# Patient Record
Sex: Female | Born: 1955 | Hispanic: No | Marital: Married | Smoking: Never smoker
Health system: Southern US, Community
[De-identification: ages and names within clinical notes are randomized; demographics above are authoritative.]

## PROBLEM LIST (undated history)

## (undated) HISTORY — PX: MANDIBLE FRACTURE SURGERY: SHX706

---

## 2016-02-27 ENCOUNTER — Emergency Department (HOSPITAL_COMMUNITY)
Admission: EM | Admit: 2016-02-27 | Discharge: 2016-02-27 | Disposition: A | Discharge status: Home / Self Care | Payer: Self-pay | Attending: Emergency Medicine | Admitting: Emergency Medicine

## 2016-02-27 ENCOUNTER — Encounter (HOSPITAL_COMMUNITY): Payer: Self-pay | Admitting: Emergency Medicine

## 2016-02-27 ENCOUNTER — Ambulatory Visit (HOSPITAL_COMMUNITY)
Admission: EM | Admit: 2016-02-27 | Discharge: 2016-02-27 | Disposition: A | Discharge status: Home / Self Care | Payer: Self-pay | Source: Home / Self Care | Attending: Family Medicine | Admitting: Family Medicine

## 2016-02-27 ENCOUNTER — Emergency Department (HOSPITAL_COMMUNITY): Payer: Self-pay

## 2016-02-27 ENCOUNTER — Other Ambulatory Visit: Payer: Self-pay

## 2016-02-27 DIAGNOSIS — R51 Headache: Secondary | ICD-10-CM | POA: Insufficient documentation

## 2016-02-27 DIAGNOSIS — Z87828 Personal history of other (healed) physical injury and trauma: Secondary | ICD-10-CM | POA: Insufficient documentation

## 2016-02-27 DIAGNOSIS — R1013 Epigastric pain: Secondary | ICD-10-CM | POA: Insufficient documentation

## 2016-02-27 DIAGNOSIS — R109 Unspecified abdominal pain: Secondary | ICD-10-CM

## 2016-02-27 DIAGNOSIS — R110 Nausea: Secondary | ICD-10-CM | POA: Insufficient documentation

## 2016-02-27 DIAGNOSIS — R519 Headache, unspecified: Secondary | ICD-10-CM

## 2016-02-27 DIAGNOSIS — Z3202 Encounter for pregnancy test, result negative: Secondary | ICD-10-CM | POA: Insufficient documentation

## 2016-02-27 DIAGNOSIS — R1010 Upper abdominal pain, unspecified: Secondary | ICD-10-CM

## 2016-02-27 LAB — COMPREHENSIVE METABOLIC PANEL
ALT: 27 U/L (ref 14–54)
ANION GAP: 10 (ref 5–15)
AST: 29 U/L (ref 15–41)
Albumin: 4.4 g/dL (ref 3.5–5.0)
Alkaline Phosphatase: 53 U/L (ref 38–126)
BUN: 11 mg/dL (ref 6–20)
CHLORIDE: 108 mmol/L (ref 101–111)
CO2: 21 mmol/L — AB (ref 22–32)
Calcium: 10.6 mg/dL — ABNORMAL HIGH (ref 8.9–10.3)
Creatinine, Ser: 0.75 mg/dL (ref 0.44–1.00)
GFR calc non Af Amer: >60 mL/min (ref >60)
Glucose, Bld: 95 mg/dL (ref 65–99)
POTASSIUM: 4.4 mmol/L (ref 3.5–5.1)
SODIUM: 139 mmol/L (ref 135–145)
Total Bilirubin: 0.8 mg/dL (ref 0.3–1.2)
Total Protein: 7 g/dL (ref 6.5–8.1)

## 2016-02-27 LAB — CBC
HEMATOCRIT: 37.5 % (ref 36.0–46.0)
HEMOGLOBIN: 12.1 g/dL (ref 12.0–15.0)
MCH: 23.6 pg — AB (ref 26.0–34.0)
MCHC: 32.3 g/dL (ref 30.0–36.0)
MCV: 73.1 fL — AB (ref 78.0–100.0)
Platelets: 261 10*3/uL (ref 150–400)
RBC: 5.13 MIL/uL — AB (ref 3.87–5.11)
RDW: 14.7 % (ref 11.5–15.5)
WBC: 6.4 10*3/uL (ref 4.0–10.5)

## 2016-02-27 LAB — LIPASE, BLOOD: Lipase: 21 U/L (ref 11–51)

## 2016-02-27 LAB — URINALYSIS, ROUTINE W REFLEX MICROSCOPIC
Bilirubin Urine: NEGATIVE
GLUCOSE, UA: NEGATIVE mg/dL
HGB URINE DIPSTICK: NEGATIVE
Ketones, ur: NEGATIVE mg/dL
Leukocytes, UA: NEGATIVE
Nitrite: NEGATIVE
Protein, ur: NEGATIVE mg/dL
pH: 7.5 (ref 5.0–8.0)

## 2016-02-27 LAB — I-STAT CG4 LACTIC ACID, ED
LACTIC ACID, VENOUS: 2.25 mmol/L — AB (ref 0.5–2.0)
Lactic Acid, Venous: 2.06 mmol/L (ref 0.5–2.0)
Lactic Acid, Venous: 0.76 mmol/L (ref 0.5–2.0)

## 2016-02-27 LAB — I-STAT TROPONIN, ED: TROPONIN I, POC: 0 ng/mL (ref 0.00–0.08)

## 2016-02-27 LAB — I-STAT BETA HCG BLOOD, ED (MC, WL, AP ONLY)

## 2016-02-27 MED ORDER — HYDROMORPHONE HCL 1 MG/ML IJ SOLN: 0.5000 mg | Freq: Once | INTRAMUSCULAR | Status: DC

## 2016-02-27 MED ORDER — HYDROMORPHONE HCL 1 MG/ML IJ SOLN
0.5000 mg | Freq: Once | INTRAMUSCULAR | Status: AC
  Administered 2016-02-27: 0.5 mg via INTRAVENOUS
  Filled 2016-02-27: qty 1

## 2016-02-27 MED ORDER — HYDROMORPHONE HCL 1 MG/ML IJ SOLN
0.5000 mg | Freq: Once | INTRAMUSCULAR | Status: AC
Start: 2016-02-27 — End: 2016-02-27
  Administered 2016-02-27: 0.5 mg via INTRAVENOUS
  Filled 2016-02-27: qty 1

## 2016-02-27 MED ORDER — ONDANSETRON HCL 4 MG/2ML IJ SOLN
4.0000 mg | INTRAMUSCULAR | Status: AC
  Administered 2016-02-27: 4 mg via INTRAVENOUS
  Filled 2016-02-27: qty 2

## 2016-02-27 MED ORDER — IOPAMIDOL (ISOVUE-300) INJECTION 61%
INTRAVENOUS | Status: AC
  Administered 2016-02-27: 100 mL
  Filled 2016-02-27: qty 100

## 2016-02-27 MED ORDER — SODIUM CHLORIDE 0.9 % IV BOLUS (SEPSIS)
1000.0000 mL | Freq: Once | INTRAVENOUS | Status: AC
  Administered 2016-02-27: 1000 mL via INTRAVENOUS

## 2016-02-27 MED ORDER — GI COCKTAIL ~~LOC~~
30.0000 mL | Freq: Once | ORAL | Status: AC
  Administered 2016-02-27: 30 mL via ORAL
  Filled 2016-02-27: qty 30

## 2016-02-27 MED ORDER — DIPHENHYDRAMINE HCL 50 MG/ML IJ SOLN
12.5000 mg | Freq: Once | INTRAMUSCULAR | Status: AC
  Administered 2016-02-27: 12.5 mg via INTRAVENOUS
  Filled 2016-02-27: qty 1

## 2016-02-27 MED ORDER — METOCLOPRAMIDE HCL 5 MG/ML IJ SOLN
10.0000 mg | Freq: Once | INTRAMUSCULAR | Status: AC
  Administered 2016-02-27: 10 mg via INTRAVENOUS
  Filled 2016-02-27: qty 2

## 2016-02-27 NOTE — Discharge Instructions (Signed)
1. Medications: tylenol or ibuprofen for headache, usual home medications 2. Treatment: rest, drink plenty of fluids 3. Follow Up: please followup with the [facility] (go to walk in clinic on Thursday or Friday) for discussion of your diagnoses and further evaluation after today's visit; if you do not have a primary care doctor use the phone number listed in your discharge paperwork to find one; please return to the ER for high fever, severe abdominal pain or headache, new or worsening symptoms   Abdominal Pain, Adult Many things can cause belly (abdominal) pain. Most times, the belly pain is not dangerous. Many cases of belly pain can be watched and treated at home. HOME CARE   Do not take medicines that help you go poop (laxatives) unless told to by your doctor.  Only take medicine as told by your doctor.  Eat or drink as told by your doctor. Your doctor will tell you if you should be on a special diet. GET HELP IF:  You do not know what is causing your belly pain.  You have belly pain while you are sick to your stomach (nauseous) or have runny poop (diarrhea).  You have pain while you pee or poop.  Your belly pain wakes you up at night.  You have belly pain that gets worse or better when you eat.  You have belly pain that gets worse when you eat fatty foods.  You have a fever. GET HELP RIGHT AWAY IF:   The pain does not go away within 2 hours.  You keep throwing up (vomiting).  The pain changes and is only in the right or left part of the belly.  You have bloody or tarry looking poop. MAKE SURE YOU:   Understand these instructions.  Will watch your condition.  Will get help right away if you are not doing well or get worse.   This information is not intended to replace advice given to you by your health care provider. Make sure you discuss any questions you have with your health care provider.   Document Released: 04/29/2008 Document Revised:  12/02/2014 Document Reviewed: 07/21/2013 Elsevier Interactive Patient Education 2016 Else[employer] Inc[employer]  General Headache Without Cause A headache is pain or discomfort felt around the head or neck area. There are many causes and types of headaches. In some cases, the cause may not be found.  HOME CARE  Managing Pain  Take over-the-counter and prescription medicines only as told by your doctor.  Lie down in a dark, quiet room when you have a headache.  If directed, apply ice to the head and neck area:  Put ice in a plastic bag.  Place a towel between your skin and the bag.  Leave the ice on for 20 minutes, 2-3 times per day.  Use a heating pad or hot shower to apply heat to the head and neck area as told by your doctor.  Keep lights dim if bright lights bother you or make your headaches worse. Eating and Drinking  Eat meals on a regular schedule.  Lessen how much alcohol you drink.  Lessen how much caffeine you drink, or stop drinking caffeine. General Instructions  Keep all follow-up visits as told by your doctor. This is important.  Keep a journal to find out if certain things bring on headaches. For example, write down:  What you eat and drink.  How much sleep you get.  Any change to your diet or medicines.  Relax by getting  a massage or doing other relaxing activities.  Lessen stress.  Sit up straight. Do not tighten (tense) your muscles.  Do not use tobacco products. This includes cigarettes, chewing tobacco, or e-cigarettes. If you need help quitting, ask your doctor.  Exercise regularly as told by your doctor.  Get enough sleep. This often means 7-9 hours of sleep. GET HELP IF:  Your symptoms are not helped by medicine.  You have a headache that feels different than the other headaches.  You feel sick to your stomach (nauseous) or you throw up (vomit).  You have a fever. GET HELP RIGHT AWAY IF:   Your headache becomes really bad.  You keep throwing  up.  You have a stiff neck.  You have trouble seeing.  You have trouble speaking.  You have pain in the eye or ear.  Your muscles are weak or you lose muscle control.  You lose your balance or have trouble walking.  You feel like you will pass out (faint) or you pass out.  You have confusion.   This information is not intended to replace advice given to you by your health care provider. Make sure you discuss any questions you have with your health care provider.   Document Released: 08/20/2008 Document Revised: 08/02/2015 Document Reviewed: 03/06/2015 Elsevier Interactive Patient Education [employer]

## 2016-02-27 NOTE — ED Notes (Signed)
Informed provider that pt is in pain

## 2016-02-27 NOTE — ED Notes (Signed)
Called lab to obtain blood work.  

## 2016-02-27 NOTE — ED Provider Notes (Signed)
CSN: [id number]     Arrival date & time 02/27/16  1406 History   First MD Initiated Contact with Patient 02/27/16 1526     Chief Complaint  Patient presents with  . Abdominal Pain    HPI   Wendy Hunter is a 60 y.o. female with no pertinent PMH who presents to the ED with epigastric abdominal pain and headache. She states her symptoms started this morning and have been constant since that time. She denies exacerbating factors. She has not tried anything for symptom relief. She notes she initially went to urgent care, though was sent to the ED for further evaluation. She denies history of similar symptoms. She reports associated nausea. She denies fever, chills, chest pain, shortness of breath, vomiting, diarrhea, constipation, dysuria, urgency, frequency.    History reviewed. No pertinent past medical history. Past Surgical History  Procedure Laterality Date  . Mandible fracture surgery     History reviewed. No pertinent family history. Social History  Substance Use Topics  . Smoking status: Never Smoker   . Smokeless tobacco: None  . Alcohol Use: No   OB History    No data available      Review of Systems  Constitutional: Negative for fever and chills.  Respiratory: Negative for shortness of breath.   Cardiovascular: Negative for chest pain.  Gastrointestinal: Positive for nausea and abdominal pain. Negative for vomiting, diarrhea and constipation.  Genitourinary: Negative for dysuria, urgency and frequency.  Neurological: Positive for headaches. Negative for dizziness, syncope, weakness, light-headedness and numbness.  All other systems reviewed and are negative.     Allergies  Review of patient's allergies indicates no known allergies.  Home Medications   Prior to Admission medications   Not on File    BP 110/64 mmHg  Pulse 68  Temp(Src) 97.8 F (36.6 C) (Oral)  Resp 19  SpO2 96% Physical Exam  Constitutional: She is oriented to person, place, and time. She  appears well-developed and well-nourished. No distress.  Patient appears uncomfortable due to pain.  HENT:  Head: Normocephalic and atraumatic.  Right Ear: External ear normal.  Left Ear: External ear normal.  Nose: Nose normal.  Mouth/Throat: Uvula is midline, oropharynx is clear and moist and mucous membranes are normal.  Eyes: Conjunctivae, EOM and lids are normal. Pupils are equal, round, and reactive to light. Right eye exhibits no discharge. Left eye exhibits no discharge. No scleral icterus.  Neck: Normal range of motion. Neck supple.  Cardiovascular: Normal rate, regular rhythm, normal heart sounds, intact distal pulses and normal pulses.   Pulmonary/Chest: Effort normal and breath sounds normal. No respiratory distress. She has no wheezes. She has no rales.  Abdominal: Soft. Normal appearance and bowel sounds are normal. She exhibits no distension and no mass. There is tenderness. There is no rigidity, no rebound and no guarding.  TTP in epigastrium. No rebound, guarding, or masses.  Musculoskeletal: Normal range of motion. She exhibits no edema or tenderness.  Neurological: She is alert and oriented to person, place, and time. She has normal strength. No cranial nerve deficit or sensory deficit.  Skin: Skin is warm, dry and intact. No rash noted. She is not diaphoretic. No erythema. No pallor.  Psychiatric: She has a normal mood and affect. Her speech is normal and behavior is normal.  Nursing note and vitals reviewed.   ED Course  Procedures (including critical care time)  Labs Review Labs Reviewed  COMPREHENSIVE METABOLIC PANEL - Abnormal; Notable for the following:  CO2 21 (*)    Calcium 10.6 (*)    All other components within normal limits  CBC - Abnormal; Notable for the following:    RBC 5.13 (*)    MCV 73.1 (*)    MCH 23.6 (*)    All other components within normal limits  URINALYSIS, ROUTINE W REFLEX MICROSCOPIC (NOT AT [facility]) - Abnormal; Notable for the  following:    Specific Gravity, Urine <1.005 (*)    All other components within normal limits  I-STAT CG4 LACTIC ACID, ED - Abnormal; Notable for the following:    Lactic Acid, Venous 2.06 (*)    All other components within normal limits  I-STAT CG4 LACTIC ACID, ED - Abnormal; Notable for the following:    Lactic Acid, Venous 2.25 (*)    All other components within normal limits  LIPASE, BLOOD  I-STAT TROPOININ, ED  I-STAT BETA HCG BLOOD, ED ([facility], [facility], [facility] ONLY)  I-STAT CG4 LACTIC ACID, ED  I-STAT CG4 LACTIC ACID, ED    Imaging Review Ct Abdomen Pelvis W Contrast  02/27/2016  CLINICAL DATA:  Epigastric pain and nausea. EXAM: CT ABDOMEN AND PELVIS WITH CONTRAST TECHNIQUE: Multidetector CT imaging of the abdomen and pelvis was performed using the standard protocol following bolus administration of intravenous contrast. CONTRAST:  1 ISOVUE-300 IOPAMIDOL (ISOVUE-300) INJECTION 61% COMPARISON:  None. FINDINGS: Lower chest:  No significant abnormality Hepatobiliary: There is a 4.3 cm benign cyst in the right hepatic lobe dome, segment 7. Several smaller hepatic hypodensities are present, too small for definitive characterization but they likely are also benign. No suspicious focal liver lesions. Biliary system is unremarkable. Pancreas: Normal Spleen: Normal Adrenals/Urinary Tract: There is a 2.3 cm benign cyst of the left upper renal pole. A smaller left renal hypodensity is present in the midpole posteriorly, also probably benign although too small for definitive characterization. The adrenals and kidneys are otherwise normal. Ureters and urinary bladder are unremarkable. Stomach/Bowel: The stomach, small bowel and colon are unremarkable. Vascular/Lymphatic: The abdominal aorta is normal in caliber. There is no atherosclerotic calcification. There is no adenopathy in the abdomen or pelvis. Reproductive: The uterus and ovaries are unremarkable. Other: There is mild-to-moderate motion degradation of the  images. No acute inflammatory changes are evident in the abdomen or pelvis. There is no ascites. Musculoskeletal: No significant skeletal lesions are evident. IMPRESSION: Motion degraded study. No acute findings are evident in the abdomen or pelvis. Electronically Signed   By: Ellery Plunk M.D.   On: 02/27/2016 18:52   I have personally reviewed and evaluated these images and lab results as part of my medical decision-making.   EKG Interpretation   Date/Time:  Tuesday February 27 2016 15:53:06 EDT Ventricular Rate:  74 PR Interval:  148 QRS Duration: 92 QT Interval:  399 QTC Calculation: 443 R Axis:   79 Text Interpretation:  Sinus rhythm agree. no change  Confirmed by  Donnald Garre, MD, Lebron Conners [phone]) on 02/27/2016 4:03:19 PM      MDM   Final diagnoses:  Abdominal pain  Headache, unspecified headache type    60 year old female presents with epigastric abdominal pain, which started today. Also notes headache. Reports associated nausea. Denies vomiting, diarrhea, constipation, dysuria, urgency, frequency.   Patient is afebrile. Heart regular rate and rhythm. Lungs clear to auscultation bilaterally. Abdomen soft, nondistended, with tenderness palpation epigastrium. No rebound, guarding, or masses. Normal neuro exam with no focal deficit. No nuchal rigidity.   Patient given fluids, GI cocktail, pain medication, antiemetic.  EKG  sinus rhythm. Troponin negative. CBC negative for leukocytosis or anemia. CMP unremarkable. Lipase within normal limits. Beta hCG negative. Initial lactic acid 2.06, repeat lactic acid 2.25. CT abdomen pelvis pending.  Patient reports improvement in abdominal pain, though notes continued headache. Will give reglan and benadryl.  CT abdomen pelvis negative for acute findings. Repeat lactic acid within normal limits. On reassessment of patient, she reports resolution of her abdominal pain and headache. Repeat abdominal exam benign. Patient is nontoxic and  well-appearing, feel she is stable for discharge at this time. Patient to follow up with PCP, information for [facility] given. Strict return precautions discussed. Patient and family member verbalizes their understanding and are in agreement with plan.   BP 110/64 mmHg  Pulse 68  Temp(Src) 97.8 F (36.6 C) (Oral)  Resp 19  SpO2 96%       Mady Gemma, PA-C 02/28/16 0038  Arby Barrette, MD 02/28/16 [phone]

## 2016-02-27 NOTE — ED Notes (Signed)
[facility]; pt moaning at present

## 2016-02-27 NOTE — ED Provider Notes (Signed)
CSN: [id number]     Arrival date & time 02/27/16  1257 History   First MD Initiated Contact with Patient 02/27/16 1325     No chief complaint on file.  (Consider location/radiation/quality/duration/timing/severity/associated sxs/prior Treatment) Patient is a 60 y.o. female presenting with abdominal pain. The history is provided by the patient and the spouse.  Abdominal Pain Pain location:  Epigastric Pain quality: cramping   Pain radiates to:  Does not radiate (no back or flank pain.) Pain severity:  Severe Onset quality:  Gradual Duration:  2 days Progression:  Waxing and waning Chronicity:  New Context comment:  No prev abd pain issues, sl nausea otherwise neg. Relieved by:  None tried Worsened by:  Nothing tried Ineffective treatments:  None tried Associated symptoms: nausea   Associated symptoms: no fever, no shortness of breath and no vomiting     No past medical history on file. No past surgical history on file. No family history on file. Social History  Substance Use Topics  . Smoking status: Not on file  . Smokeless tobacco: Not on file  . Alcohol Use: Not on file   OB History    No data available     Review of Systems  Constitutional: Negative.  Negative for fever.  HENT: Negative.   Respiratory: Negative.  Negative for shortness of breath.   Cardiovascular: Negative.   Gastrointestinal: Positive for nausea and abdominal pain. Negative for vomiting.  Genitourinary: Negative.   All other systems reviewed and are negative.   Allergies  Review of patient's allergies indicates not on file.  Home Medications   Prior to Admission medications   Not on File   Meds Ordered and Administered this Visit  Medications - No data to display  BP 145/72 mmHg  Pulse 82  Resp 20  SpO2 100% No data found.   Physical Exam  Constitutional: She is oriented to person, place, and time. She appears well-developed and well-nourished. She is cooperative. She appears ill.  She appears distressed.  Neck: Normal range of motion. Neck supple.  Cardiovascular: Normal rate, regular rhythm, normal heart sounds and intact distal pulses.   Pulmonary/Chest: Effort normal and breath sounds normal. Tachypnea noted.  Lymphadenopathy:    She has no cervical adenopathy.  Neurological: She is alert and oriented to person, place, and time.  Skin: Skin is warm and dry.  Nursing note and vitals reviewed.   ED Course  Procedures (including critical care time)  Labs Review Labs Reviewed - No data to display  Imaging Review No results found.   Visual Acuity Review  Right Eye Distance:   Left Eye Distance:   Bilateral Distance:    Right Eye Near:   Left Eye Near:    Bilateral N  ED ECG REPORT   Date: 02/27/2016  Rate: 79  Rhythm: normal sinus rhythm  QRS Axis: normal  Intervals: normal  ST/T Wave abnormalities: normal  Conduction Disutrbances:none  Narrative Interpretation:   Old EKG Reviewed: none available  I have personally reviewed the EKG tracing and agree with the computerized printout as noted.    MDM  No diagnosis found. Sent for acute worsening epigastric pain uncertain etiol.    Linna HoffJames D Challis Crill, MD 02/27/16 1351

## 2016-02-27 NOTE — ED Notes (Signed)
Patient transported to CT 

## 2016-02-27 NOTE — ED Notes (Signed)
Epigastric pain that is severe today. Pain minimal yesterday, but pain is 10/10 today.  Patient has nausea, no vomiting, no diarrhea.  Denies urinary symptoms.  Patient ate soup this morning.  Reports normal bm yesterday.  Spouse is translating for patient.

## 2017-09-24 IMAGING — CT CT ABD-PELV W/ CM
2 of 5 series · 16 of 46 positions shown, 18 images · IV contrast (iopamidol)
Comparison: None.

CLINICAL DATA: Epigastric pain and nausea.

EXAM:
CT ABDOMEN AND PELVIS WITH CONTRAST
TECHNIQUE: Multidetector CT imaging of the abdomen and pelvis was performed
using the standard protocol following bolus administration of
intravenous contrast.
CONTRAST:  1 44B0WU-IXX IOPAMIDOL (44B0WU-IXX) INJECTION 61%

[Series 3: abd/ pelvis 5.0 i30f 1 · axial · 0.72mm/px · z∈[+1297,+1677]mm · 13 of 86 slices shown, 15 images]
[im 5/86  soft-tissue]
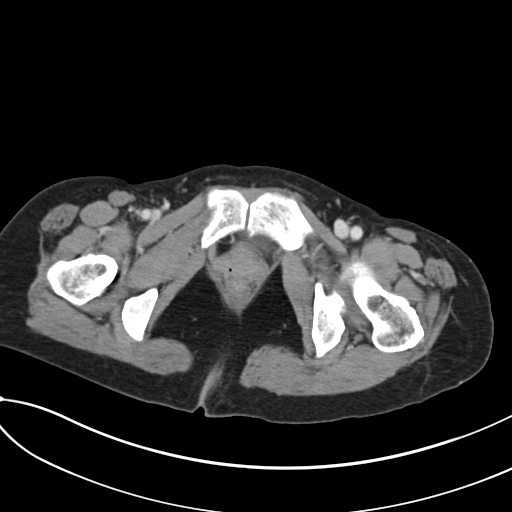
[im 5/86  bone]
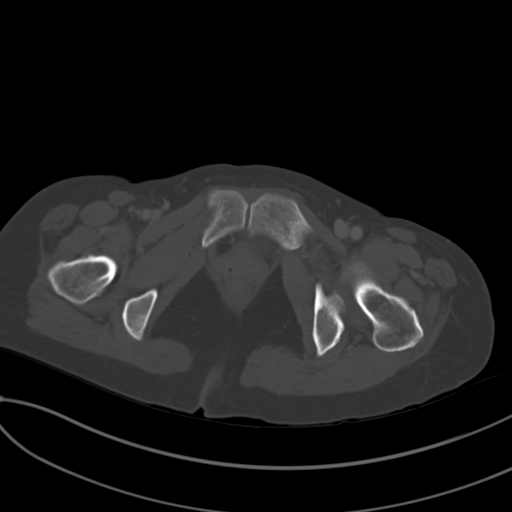
[im 14/86  soft-tissue]
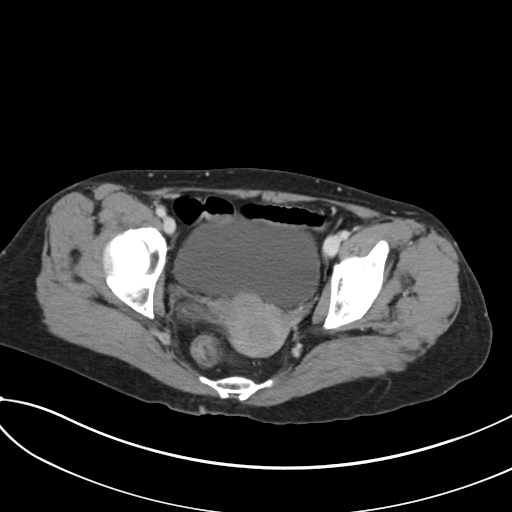
[im 18/86  soft-tissue]
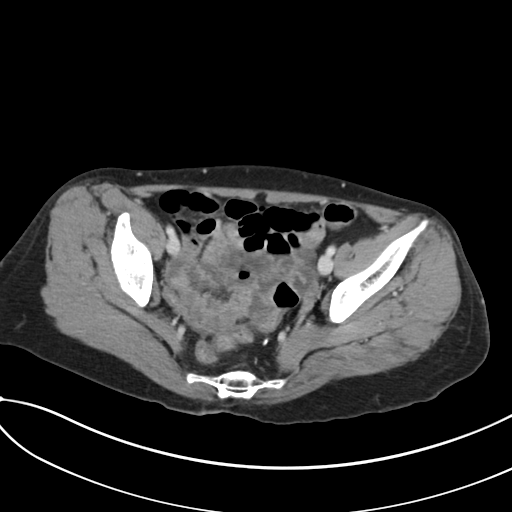
[im 23/86  soft-tissue]
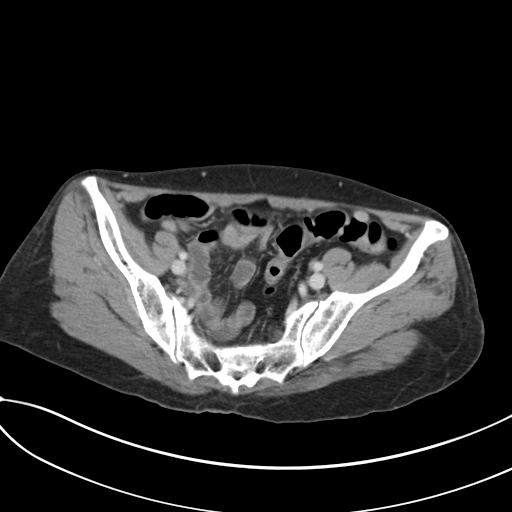
[im 32/86  soft-tissue]
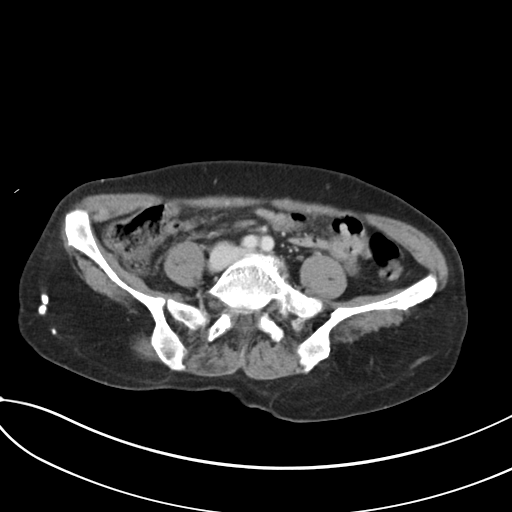
[im 36/86  soft-tissue]
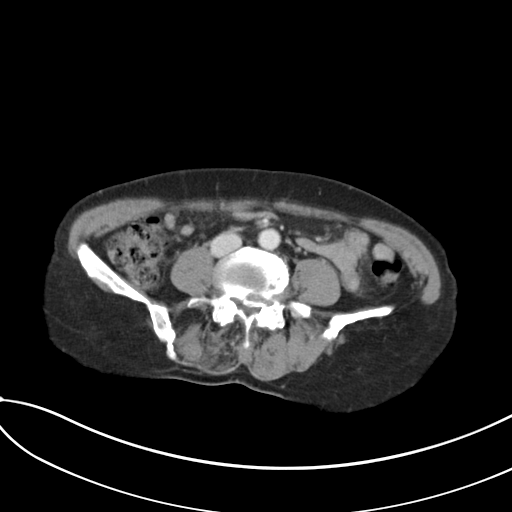
[im 45/86  soft-tissue]
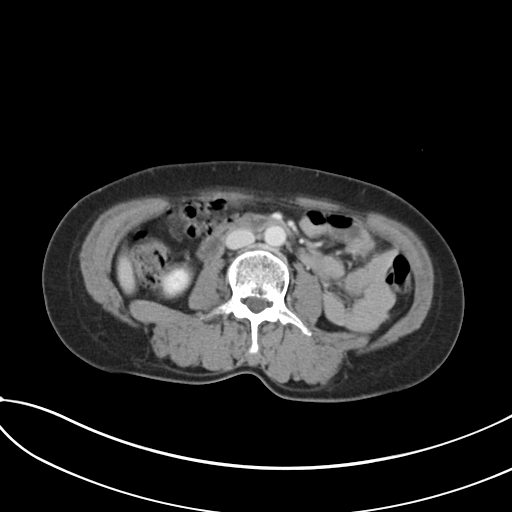
[im 50/86  soft-tissue]
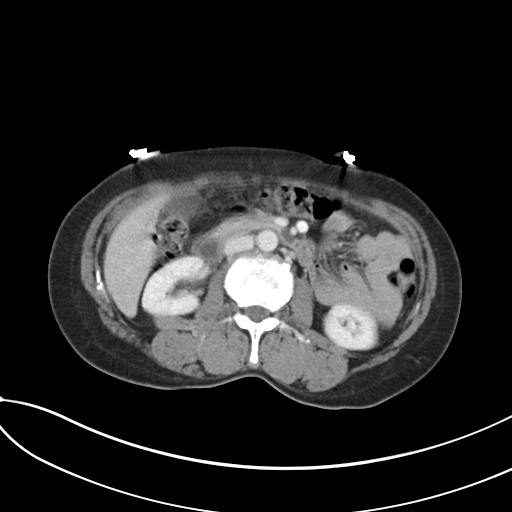
[im 54/86  soft-tissue]
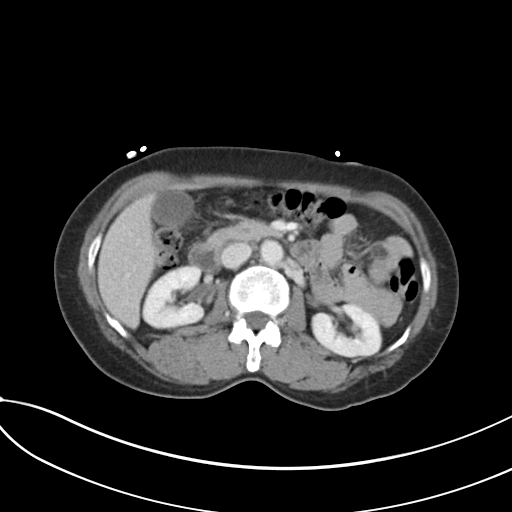
[im 54/86  bone]
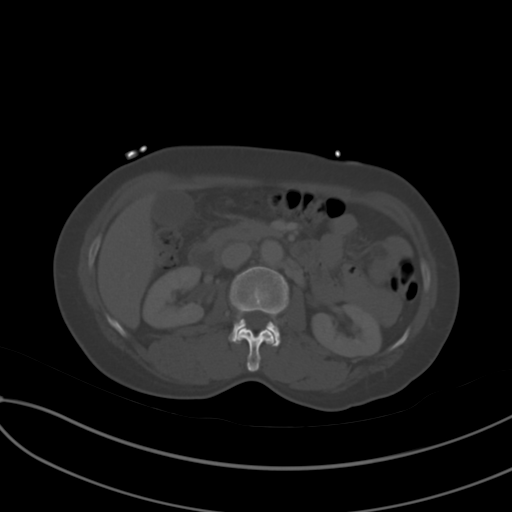
[im 63/86  soft-tissue]
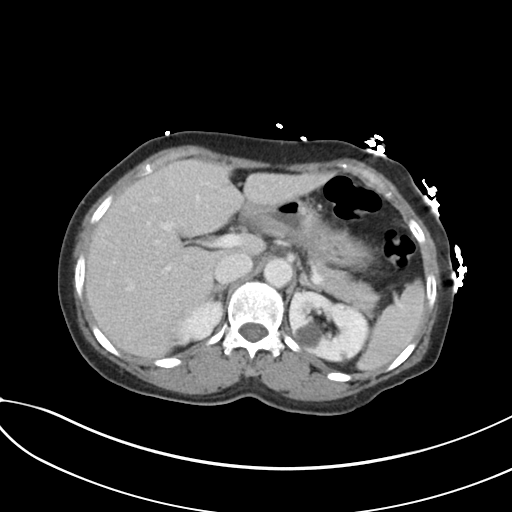
[im 68/86  soft-tissue]
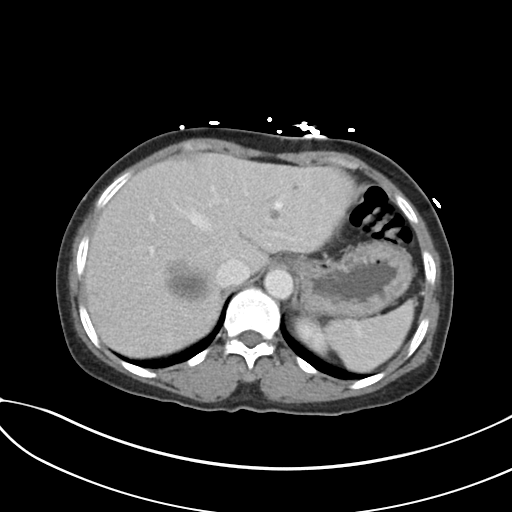
[im 72/86  soft-tissue]
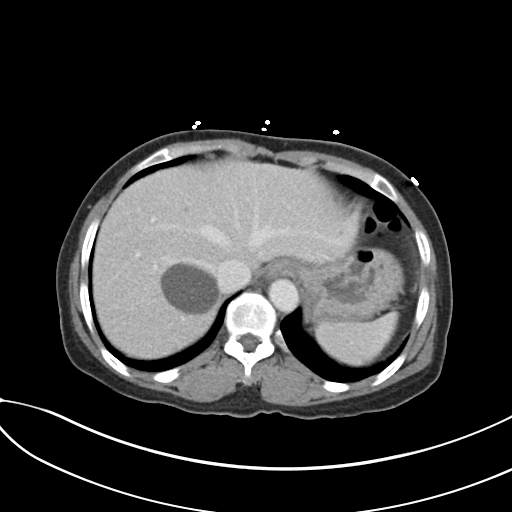
[im 81/86  soft-tissue]
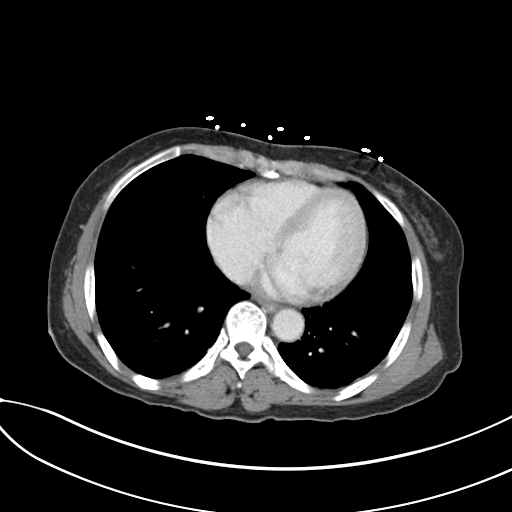

[Series 6: coronal soft tissue · coronal · 0.68mm/px · 3 of 71 slices shown]
[im 24/71  soft-tissue]
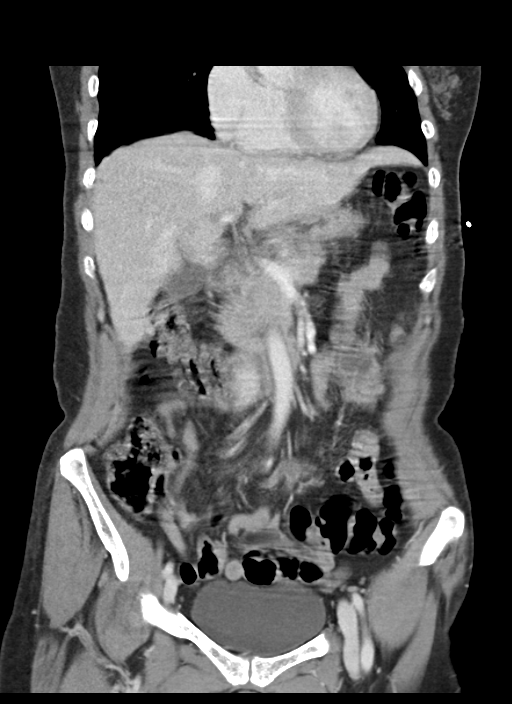
[im 32/71  soft-tissue]
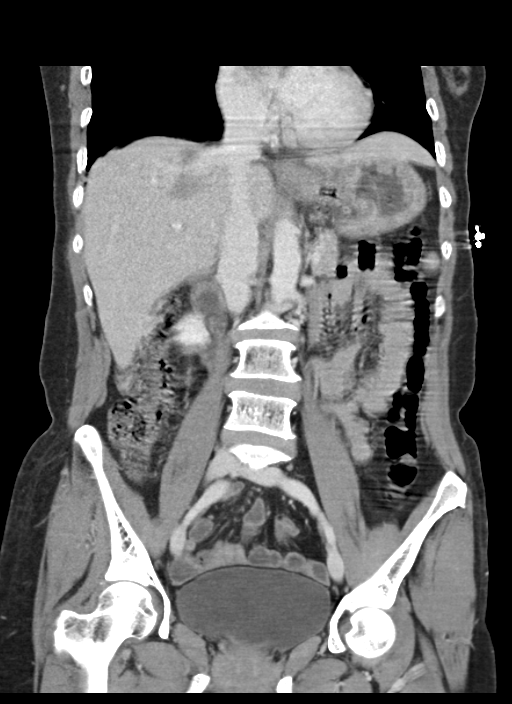
[im 39/71  soft-tissue]
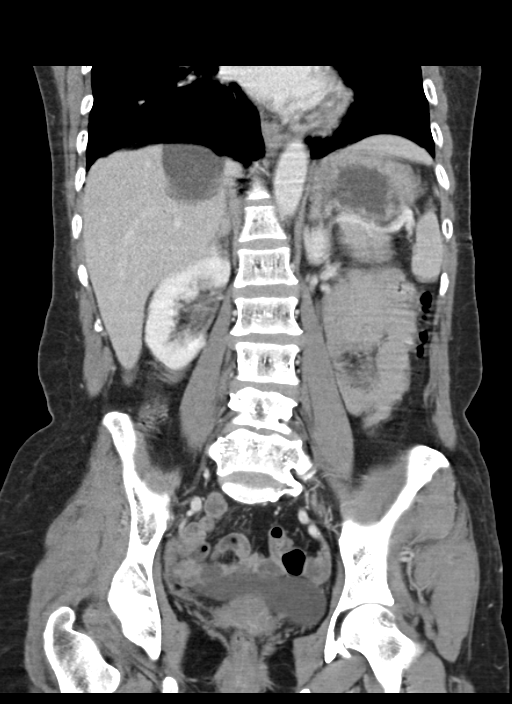

[16 of 46 positions shown; findings below may reference images not displayed]

FINDINGS: Lower chest:  No significant abnormality

Hepatobiliary: There is a 4.3 cm benign cyst in the right hepatic
lobe dome, segment 7. Several smaller hepatic hypodensities are
present, too small for definitive characterization but they likely
are also benign. No suspicious focal liver lesions. Biliary system
is unremarkable.

Pancreas: Normal

Spleen: Normal

Adrenals/Urinary Tract: There is a 2.3 cm benign cyst of the left
upper renal pole. A smaller left renal hypodensity is present in the
midpole posteriorly, also probably benign although too small for
definitive characterization. The adrenals and kidneys are otherwise
normal. Ureters and urinary bladder are unremarkable.

Stomach/Bowel: The stomach, small bowel and colon are unremarkable.

Vascular/Lymphatic: The abdominal aorta is normal in caliber. There
is no atherosclerotic calcification. There is no adenopathy in the
abdomen or pelvis.

Reproductive: The uterus and ovaries are unremarkable.

Other: There is mild-to-moderate motion degradation of the images.

No acute inflammatory changes are evident in the abdomen or pelvis.
There is no ascites.

Musculoskeletal: No significant skeletal lesions are evident.
IMPRESSION: Motion degraded study. No acute findings are evident in the abdomen
or pelvis.

## 2020-10-27 ENCOUNTER — Emergency Department (HOSPITAL_COMMUNITY)
Admission: EM | Admit: 2020-10-27 | Discharge: 2020-10-27 | Disposition: A | Discharge status: Home / Self Care | Payer: Self-pay | Attending: Emergency Medicine | Admitting: Emergency Medicine

## 2020-10-27 ENCOUNTER — Other Ambulatory Visit: Payer: Self-pay

## 2020-10-27 DIAGNOSIS — L0231 Cutaneous abscess of buttock: Secondary | ICD-10-CM | POA: Insufficient documentation

## 2020-10-27 MED ORDER — ONDANSETRON 4 MG PO TBDP
4.0000 mg | ORAL_TABLET | Freq: Once | ORAL | Status: AC
  Administered 2020-10-27: 4 mg via ORAL
  Filled 2020-10-27: qty 1

## 2020-10-27 MED ORDER — HYDROCODONE-ACETAMINOPHEN 5-325 MG PO TABS
1.0000 | ORAL_TABLET | Freq: Once | ORAL | Status: AC
  Administered 2020-10-27: 1 via ORAL
  Filled 2020-10-27: qty 1

## 2020-10-27 MED ORDER — LIDOCAINE HCL (PF) 1 % IJ SOLN
5.0000 mL | Freq: Once | INTRAMUSCULAR | Status: AC
  Administered 2020-10-27: 5 mL
  Filled 2020-10-27: qty 5

## 2020-10-27 MED ORDER — SULFAMETHOXAZOLE-TRIMETHOPRIM 800-160 MG PO TABS: 1.0000 | ORAL_TABLET | Freq: Two times a day (BID) | ORAL | 0 refills | Status: AC

## 2020-10-27 NOTE — ED Provider Notes (Signed)
[facility] EMERGENCY DEPARTMENT Provider Note   CSN: [id number] Arrival date & time: 10/27/20  1006     History Chief Complaint  Patient presents with  . Abscess    Wendy Hunter is a 64 y.o. female.  Presents with left buttock abscess x5 days.  States that she went to work yesterday but can sit down due to pain.  She is noted some bleeding from the abscess but feels like is getting worse and presents to the ER.  Denies fevers vomiting cough or diarrhea.  Denies prior abscesses in this region.        No past medical history on file.  There are no problems to display for this patient.   Past Surgical History:  Procedure Laterality Date  . MANDIBLE FRACTURE SURGERY       OB History   No obstetric history on file.     No family history on file.  Social History   Tobacco Use  . Smoking status: Never Smoker  Substance Use Topics  . Alcohol use: No  . Drug use: No    Home Medications Prior to Admission medications   Medication Sig Start Date End Date Taking? Authorizing Provider  sulfamethoxazole-trimethoprim (BACTRIM DS) 800-160 MG tablet Take 1 tablet by mouth 2 (two) times daily for 7 days. 10/27/20 11/03/20  Cheryll Cockayne, MD    Allergies    Patient has no known allergies.  Review of Systems   Review of Systems  Constitutional: Negative for fever.  HENT: Negative for ear pain.   Eyes: Negative for pain.  Respiratory: Negative for cough.   Cardiovascular: Negative for chest pain.  Gastrointestinal: Negative for abdominal pain.  Genitourinary: Negative for flank pain.  Musculoskeletal: Negative for back pain.  Skin: Positive for wound. Negative for rash.  Neurological: Negative for headaches.    Physical Exam Updated Vital Signs BP 134/79   Pulse 69   Temp 98 F (36.7 C) (Oral)   Resp 19   SpO2 100%   Physical Exam Constitutional:      General: She is not in acute distress.    Appearance: Normal appearance.  HENT:      Head: Normocephalic.     Nose: Nose normal.  Eyes:     Extraocular Movements: Extraocular movements intact.  Cardiovascular:     Rate and Rhythm: Normal rate.  Pulmonary:     Effort: Pulmonary effort is normal.  Abdominal:     Palpations: Abdomen is soft.     Tenderness: There is no abdominal tenderness.  Musculoskeletal:        General: Normal range of motion.     Cervical back: Normal range of motion.  Skin:    General: Skin is warm.     Findings: Lesion present.     Comments: Left buttock 3 cm abscess.  Additional 1 cm surrounding region of cellulitis.  Neurological:     General: No focal deficit present.     Mental Status: She is alert.     ED Results / Procedures / Treatments   Labs (all labs ordered are listed, but only abnormal results are displayed) Labs Reviewed - No data to display  EKG None  Radiology No results found.  Procedures .Marland KitchenIncision and Drainage  Date/Time: 10/27/2020 11:23 AM Performed by: Cheryll Cockayne, MD Authorized by: Cheryll Cockayne, MD   Consent:    Consent obtained:  Verbal   Consent given by:  Patient   Risks discussed:  Bleeding, incomplete  drainage, infection and damage to other organs   Alternatives discussed:  Alternative treatment Location:    Type:  Abscess Comments:     Left buttock abscess.  Lidocaine 1% no epinephrine total of 2 cc instilled.  11 blade used to make a 2.5 cm incision.  Skin in this region is necrotic and friable.  Approximately 4 cc of serosanguineous, purulent fluid expressed.  Additional lidocaine instilled into the abscess space.  Curved Kelly used to explore for deeper septations.  Wound was packed with quarter inch iodoform gauze approximately 10 cm.  Patient advised return in 2 days for wound check.  Advised sitz bath's daily.   (including critical care time)  Medications Ordered in ED Medications  HYDROcodone-acetaminophen (NORCO/VICODIN) 5-325 MG per tablet 1 tablet (1 tablet Oral Given 10/27/20 1102)    ondansetron (ZOFRAN-ODT) disintegrating tablet 4 mg (4 mg Oral Given 10/27/20 1102)  lidocaine (PF) (XYLOCAINE) 1 % injection 5 mL (5 mLs Infiltration Given 10/27/20 1103)    ED Course  I have reviewed the triage vital signs and the nursing notes.  Pertinent labs & imaging results that were available during my care of the patient were reviewed by me and considered in my medical decision making (see chart for details).    MDM Rules/Calculators/A&P                          Patient advised return in 2 days for wound check.  Given a prescription of Bactrim to go home with.  Advised me to return for fevers worsening symptoms worsening pain or any additional concerns.   Final Clinical Impression(s) / ED Diagnoses Final diagnoses:  Left buttock abscess    Rx / DC Orders ED Discharge Orders         Ordered    sulfamethoxazole-trimethoprim (BACTRIM DS) 800-160 MG tablet  2 times daily        10/27/20 1128           Cheryll Cockayne, MD 10/27/20 1128

## 2020-10-27 NOTE — Discharge Instructions (Addendum)
Call your primary care doctor schedule follow-up visit in 1 week.  Return to the ER in 2 days for a wound check.  Perform daily sitz baths.  Keep the packing in place, if it should fall out you may clip the excess packing.  If it falls out completely, leave the wound alone, and you may follow-up with your regular appointment.   Return immediately back to the ER if:  Your symptoms worsen within the next 12-24 hours. You develop new symptoms such as new fevers, persistent vomiting, new pain, shortness of breath, or new weakness or numbness, or if you have any other concerns.

## 2020-10-27 NOTE — ED Triage Notes (Signed)
Pt with dressing to draining abscess to buttocks x 4 days. Pt moaning in pain and unable to sit in such a way that puts pressure on the area.

## 2020-10-30 ENCOUNTER — Encounter (HOSPITAL_COMMUNITY): Payer: Self-pay | Admitting: Emergency Medicine

## 2020-10-30 ENCOUNTER — Emergency Department (HOSPITAL_COMMUNITY)
Admission: EM | Admit: 2020-10-30 | Discharge: 2020-10-30 | Disposition: A | Discharge status: Home / Self Care | Payer: Self-pay | Attending: Emergency Medicine | Admitting: Emergency Medicine

## 2020-10-30 DIAGNOSIS — Z48817 Encounter for surgical aftercare following surgery on the skin and subcutaneous tissue: Secondary | ICD-10-CM | POA: Insufficient documentation

## 2020-10-30 DIAGNOSIS — Z5189 Encounter for other specified aftercare: Secondary | ICD-10-CM

## 2020-10-30 NOTE — Discharge Instructions (Addendum)
To the emergency department today to have your left buttocks wound rechecked.  Your exam today was reassuring that the wound is healing.  Please continue to use the antibiotics were prescribed.  Please start doing sitz baths, I have included information.  Please continue to monitor the wound.  You may have diarrhea from the antibiotics.  It is very important that you continue to take the antibiotics even if you get diarrhea unless a medical professional tells you that you may stop taking them.  If you stop too early the bacteria you are being treated for will become stronger and you may need different, more powerful antibiotics that have more side effects and worsening diarrhea.  Please stay well hydrated and consider probiotics as they may decrease the severity of your diarrhea.    Please return to the emergency department if:  Wound starts draining foul-smelling yellow/green fluid You develop a high fever >102F You have worsening pain  The wound does not improve

## 2020-10-30 NOTE — ED Triage Notes (Signed)
Pt was seen her eon 12/3 and had abscess on left buttocks drained and packed was advised to return in 2 days for recheck.

## 2020-10-30 NOTE — ED Provider Notes (Signed)
[facility] EMERGENCY DEPARTMENT Provider Note   CSN: [id number] Arrival date & time: 10/30/20  1012     History Chief Complaint  Patient presents with  . Wound Check    Wendy Hunter is a 64 y.o. female with no pertinent past medical history.  Patient presents today for a wound check.  Patient was seen on 12/3 for a left buttock abscess.  The abscess was drained and packed; patient was placed on Bactrim and told to follow-up in 2 days for wound check.  Patient reports taking her antibiotics as prescribed.  Patient denies any change to the erythema surrounding her abscess.  Patient denies any fevers, chills, nausea, vomiting, pain with defecation or purulent discharge.  Patient denies performing any sitz bath's at home.  An interpreter was used to conduct this interview.   HPI     History reviewed. No pertinent past medical history.  There are no problems to display for this patient.   Past Surgical History:  Procedure Laterality Date  . MANDIBLE FRACTURE SURGERY       OB History   No obstetric history on file.     No family history on file.  Social History   Tobacco Use  . Smoking status: Never Smoker  Substance Use Topics  . Alcohol use: No  . Drug use: No    Home Medications Prior to Admission medications   Medication Sig Start Date End Date Taking? Authorizing Provider  sulfamethoxazole-trimethoprim (BACTRIM DS) 800-160 MG tablet Take 1 tablet by mouth 2 (two) times daily for 7 days. 10/27/20 11/03/20  Cheryll Cockayne, MD    Allergies    Patient has no known allergies.  Review of Systems   Review of Systems  Constitutional: Negative for chills and fever.  Respiratory: Negative for shortness of breath.   Cardiovascular: Negative for chest pain.  Gastrointestinal: Negative for blood in stool, constipation, diarrhea, nausea, rectal pain and vomiting.  Genitourinary: Negative for difficulty urinating and dysuria.  Skin: Positive for wound.  Negative for color change and rash.  Neurological: Negative for dizziness, syncope, light-headedness and headaches.  Psychiatric/Behavioral: Negative for confusion.    Physical Exam Updated Vital Signs BP 132/74 (BP Location: Left Arm)   Pulse 93   Temp 98.3 F (36.8 C) (Oral)   Resp 16   SpO2 98%   Physical Exam Constitutional:      General: She is not in acute distress.    Appearance: She is normal weight. She is not ill-appearing, toxic-appearing or diaphoretic.  HENT:     Head: Normocephalic.  Cardiovascular:     Rate and Rhythm: Normal rate.  Pulmonary:     Effort: Pulmonary effort is normal.  Skin:    General: Skin is warm and dry.     Comments: Approximately 3 cm wound located to left medial buttocks, wound is surround by ~1-1.5cm area of erythema surronding, no areas of fluctuance, no purulent drainage, no ttp, no involvement of rectum, vaginal or perineal areas  Neurological:     General: No focal deficit present.     Mental Status: She is alert.  Psychiatric:        Behavior: Behavior is cooperative.     ED Results / Procedures / Treatments   Labs (all labs ordered are listed, but only abnormal results are displayed) Labs Reviewed - No data to display  EKG None  Radiology No results found.  Procedures Procedures (including critical care time)  Medications Ordered in ED Medications -  No data to display  ED Course  I have reviewed the triage vital signs and the nursing notes.  Pertinent labs & imaging results that were available during my care of the patient were reviewed by me and considered in my medical decision making (see chart for details).    MDM Rules/Calculators/A&P                          Patient is an alert 65 year old female in no acute distress.  Patient presents to the emergency department today for a wound check.  Patient had had an abscess incised and drained on 12/3.  The wound was packed at that time, patient started on Bactrim,  told to perform sitz bath's, and return in 2 days for wound check.  Patient reports taking antibiotics as prescribed.  Patient denies any purulent discharge, change to erythema around the abscess, fever, chills, nausea, vomiting, pain with defecation.  Patient reports that she is not performing sitz bath's at home.  Upon exam patient has a open wound approximately 3 cm located to the soft tissue of her medial left buttocks.  There is surrounding erythema approximately 1 to 1.5 cm surrounding the wound.  No fluctuance observed.  There is no involvement with the rectum, vaginal or perianal areas.  No packing was found inside the wound.  There was no purulent discharge.  Patient had no tenderness to palpation.  Patient was deemed stable and cleared for discharge.  Patient was told to continue using her antibiotics as prescribed until the entire course was completed.  Patient was told to perform sitz bath at home and given instructions on how to do so.  Patient was told to keep the wound clean and covered.  Patient was told to use antibiotic ointment.  Patient was given strict return precautions.  Patient expressed understanding of instructions.   Final Clinical Impression(s) / ED Diagnoses Final diagnoses:  Wound check, abscess    Rx / DC Orders ED Discharge Orders    None       Berneice Heinrich 10/30/20 Lynford Citizen, MD 10/31/20 [phone]

## 2020-10-30 NOTE — ED Provider Notes (Signed)
This patient is a 64 year old female presenting for a wound check, she had an abscess that was drained, placed on Bactrim, on my exam she has a open wound to the buttock, this is in the soft tissue not involving the rectum or the vaginal or perineal areas, appears to be more like a superficial decubitus after drainage.  She is on Bactrim, no signs of surrounding significant cellulitis, vital signs are normal and she says she has no pain.  Stable for discharge to follow-up in the outpatient setting  Medical screening examination/treatment/procedure(s) were conducted as a shared visit with non-physician practitioner(s) and myself.  I personally evaluated the patient during the encounter.  Clinical Impression:   Final diagnoses:  Wound check, abscess         Eber Hong, MD 10/31/20 [phone]
